# Patient Record
Sex: Female | Born: 1980 | Race: Black or African American | Hispanic: No | Marital: Single | Smoking: Never smoker
Health system: Southern US, Community
[De-identification: ages and names within clinical notes are randomized; demographics above are authoritative.]

---

## 2016-09-07 ENCOUNTER — Emergency Department (HOSPITAL_COMMUNITY)
Admission: EM | Admit: 2016-09-07 | Discharge: 2016-09-07 | Disposition: A | Discharge status: Home / Self Care | Payer: Managed Care, Other (non HMO) | Attending: Emergency Medicine | Admitting: Emergency Medicine

## 2016-09-07 ENCOUNTER — Emergency Department (HOSPITAL_COMMUNITY): Payer: Managed Care, Other (non HMO)

## 2016-09-07 ENCOUNTER — Encounter (HOSPITAL_COMMUNITY): Payer: Self-pay | Admitting: *Deleted

## 2016-09-07 DIAGNOSIS — G43809 Other migraine, not intractable, without status migrainosus: Secondary | ICD-10-CM | POA: Insufficient documentation

## 2016-09-07 DIAGNOSIS — G43109 Migraine with aura, not intractable, without status migrainosus: Secondary | ICD-10-CM

## 2016-09-07 DIAGNOSIS — R51 Headache: Secondary | ICD-10-CM | POA: Diagnosis present

## 2016-09-07 DIAGNOSIS — M79606 Pain in leg, unspecified: Secondary | ICD-10-CM | POA: Insufficient documentation

## 2016-09-07 LAB — COMPREHENSIVE METABOLIC PANEL
ALK PHOS: 53 U/L (ref 38–126)
ALT: 19 U/L (ref 14–54)
ANION GAP: 9 (ref 5–15)
AST: 19 U/L (ref 15–41)
Albumin: 3.7 g/dL (ref 3.5–5.0)
BILIRUBIN TOTAL: 0.3 mg/dL (ref 0.3–1.2)
BUN: 15 mg/dL (ref 6–20)
CALCIUM: 9.3 mg/dL (ref 8.9–10.3)
CO2: 23 mmol/L (ref 22–32)
CREATININE: 0.67 mg/dL (ref 0.44–1.00)
Chloride: 106 mmol/L (ref 101–111)
GFR calc non Af Amer: >60 mL/min (ref >60)
Glucose, Bld: 92 mg/dL (ref 65–99)
Potassium: 4 mmol/L (ref 3.5–5.1)
SODIUM: 138 mmol/L (ref 135–145)
TOTAL PROTEIN: 7.4 g/dL (ref 6.5–8.1)

## 2016-09-07 LAB — CBC WITH DIFFERENTIAL/PLATELET
Basophils Absolute: 0 10*3/uL (ref 0.0–0.1)
Basophils Relative: 0 %
EOS ABS: 0.2 10*3/uL (ref 0.0–0.7)
Eosinophils Relative: 3 %
HEMATOCRIT: 34 % — AB (ref 36.0–46.0)
HEMOGLOBIN: 11.9 g/dL — AB (ref 12.0–15.0)
LYMPHS ABS: 2.5 10*3/uL (ref 0.7–4.0)
LYMPHS PCT: 36 %
MCH: 27.1 pg (ref 26.0–34.0)
MCHC: 35 g/dL (ref 30.0–36.0)
MCV: 77.4 fL — AB (ref 78.0–100.0)
MONOS PCT: 7 %
Monocytes Absolute: 0.5 10*3/uL (ref 0.1–1.0)
NEUTROS ABS: 3.8 10*3/uL (ref 1.7–7.7)
NEUTROS PCT: 54 %
Platelets: 338 10*3/uL (ref 150–400)
RBC: 4.39 MIL/uL (ref 3.87–5.11)
RDW: 13.9 % (ref 11.5–15.5)
WBC: 6.9 10*3/uL (ref 4.0–10.5)

## 2016-09-07 LAB — I-STAT BETA HCG BLOOD, ED (MC, WL, AP ONLY): I-stat hCG, quantitative: <5 m[IU]/mL (ref <5)

## 2016-09-07 MED ORDER — SODIUM CHLORIDE 0.9 % IV BOLUS (SEPSIS)
1000.0000 mL | Freq: Once | INTRAVENOUS | Status: AC
  Administered 2016-09-07: 1000 mL via INTRAVENOUS

## 2016-09-07 MED ORDER — PROMETHAZINE HCL 25 MG/ML IJ SOLN
25.0000 mg | Freq: Once | INTRAMUSCULAR | Status: AC
  Administered 2016-09-07: 25 mg via INTRAVENOUS
  Filled 2016-09-07: qty 1

## 2016-09-07 NOTE — ED Notes (Signed)
Bed: WA02 Expected date:  Expected time:  Means of arrival:  Comments: EMS-H/A

## 2016-09-07 NOTE — Discharge Instructions (Signed)
Continue excedrin for headaches.   See your neurologist outpatient   Return to ER if you have worse headaches, vomiting, fever, neck pain, blurry vision, numbness, weakness

## 2016-09-07 NOTE — ED Triage Notes (Addendum)
Per EMS, pt comes from work. Pt called EMS d/t "migraine coming on" and sudden leg pain. Pt had pain in legs with movement on scene, but denies leg pain at this time and states the leg pain has resolved.  Pt stated she took 2 excedrin migraine tablets before EMS arrived and now states pain is 2/10 and she would like to go home.  Pt is alert x 4 and ambulatory, no neuro deficits on primary assessment.

## 2016-09-07 NOTE — ED Provider Notes (Signed)
[facility]-EMERGENCY DEPT Provider Note   CSN: [id number] Arrival date & time: 09/07/16  1233     History   Chief Complaint Chief Complaint  Patient presents with  . Headache    HPI Taylor Tanner is a 36 y.o. female history of pseudotumor cerebri, migraines, here presenting with headache, leg pain and numbness. Patient states that she has sudden onset of headache around 10:30 AM this morning. It is associated with bilateral leg numbness and mild blurry vision. She did not eat anything for breakfast and took 2 Excedrin  food but still had headache so called EMS to come to the ED. She states that since she took 2 Excedrin's, her headache has improved and her numbness has improved. Patient told me that she has a history of pseudotumor cerebri that was confirmed on LP several years ago. At that time she had very bad blurry vision. She was on diamox but couldn't afford it so is taking lasix currently. She also has chronic back pain and hx of sciatica.    The history is provided by the patient.    History reviewed. No pertinent past medical history.  There are no active problems to display for this patient.   History reviewed. No pertinent surgical history.  OB History    No data available       Home Medications    Prior to Admission medications   Medication Sig Start Date End Date Taking? Authorizing Provider  aspirin-acetaminophen-caffeine (EXCEDRIN MIGRAINE) [phone] MG tablet Take 2 tablets by mouth every 6 (six) hours as needed for headache.   Yes [provider]  furosemide (LASIX) 20 MG tablet Take 20 mg by mouth daily. 09/03/16  Yes [provider]    Family History No family history on file.  Social History Social History  Substance Use Topics  . Smoking status: Never Smoker  . Smokeless tobacco: Never Used  . Alcohol use No     Allergies   Other   Review of Systems Review of Systems  Neurological: Positive for numbness and headaches.  All  other systems reviewed and are negative.    Physical Exam Updated Vital Signs BP (!) 136/92 (BP Location: Right Arm)   Pulse 93   Temp 98.3 F (36.8 C) (Oral)   Resp 18   Ht 5\' 4"  (1.626 m)   Wt 127 kg (280 lb)   LMP 08/17/2016 (Exact Date)   SpO2 100%   BMI 48.06 kg/m   Physical Exam  Constitutional: She is oriented to person, place, and time.  Slightly uncomfortable   HENT:  Head: Normocephalic.  Mouth/Throat: Oropharynx is clear and moist.  Eyes: EOM are normal. Pupils are equal, round, and reactive to light.  No obvious papilledema on fundoscopic exam   Neck: Normal range of motion. Neck supple.  Cardiovascular: Normal rate, regular rhythm and normal heart sounds.   Pulmonary/Chest: Effort normal and breath sounds normal. No respiratory distress. She has no wheezes. She has no rales.  Abdominal: Soft. Bowel sounds are normal. She exhibits no distension. There is no tenderness. There is no guarding.  Musculoskeletal: Normal range of motion.  Neurological: She is alert and oriented to person, place, and time. No cranial nerve deficit. Coordination normal.  CN 2-12 intact. Nl strength and sensation throughout. No saddle anesthesia   Skin: Skin is warm.  Psychiatric: She has a normal mood and affect.  Nursing note and vitals reviewed.    ED Treatments / Results  Labs (all labs ordered are listed,  but only abnormal results are displayed) Labs Reviewed  CBC WITH DIFFERENTIAL/PLATELET - Abnormal; Notable for the following:       Result Value   Hemoglobin 11.9 (*)    HCT 34.0 (*)    MCV 77.4 (*)    All other components within normal limits  COMPREHENSIVE METABOLIC PANEL  I-STAT BETA HCG BLOOD, ED ([facility], [facility], [facility] ONLY)    EKG  EKG Interpretation None       Radiology Ct Head Wo Contrast  Result Date: 09/07/2016 CLINICAL DATA:  Severe migraine.  This began yesterday. EXAM: CT HEAD WITHOUT CONTRAST TECHNIQUE: Contiguous axial images were obtained from the base of  the skull through the vertex without intravenous contrast. COMPARISON:  None. FINDINGS: Brain: No evidence of acute infarction, hemorrhage, hydrocephalus, extra-axial collection or mass lesion/mass effect. Normal cerebral volume. No white matter disease. Vascular: No hyperdense vessel or unexpected calcification. Skull: Normal. Negative for fracture or focal lesion. Sinuses/Orbits: No acute finding. Other: None. IMPRESSION: Negative exam. Electronically Signed   By: Elsie Stain M.D.   On: 09/07/2016 14:46    Procedures Procedures (including critical care time)  Medications Ordered in ED Medications  sodium chloride 0.9 % bolus 1,000 mL (1,000 mLs Intravenous New Bag/Given 09/07/16 1325)  promethazine (PHENERGAN) injection 25 mg (25 mg Intravenous Given 09/07/16 1332)     Initial Impression / Assessment and Plan / ED Course  I have reviewed the triage vital signs and the nursing notes.  Pertinent labs & imaging results that were available during my care of the patient were reviewed by me and considered in my medical decision making (see chart for details).     Taylor Tanner is a 36 y.o. female here with headache. Has hx of pseudotumor and hx of migraines. Has nl neuro exam and no obvious optic disc edema on fundoscopic exam. Consider SAH given sudden onset headache. Within 4 hr window so if CT head neg, won't need LP. Will give migraine cocktail. I think likely complex migraines.   2:56 PM Labs and CT head unremarkable. Headaches controlled. Leg numbness improved and nonfocal neuro exam. Has neuro follow up at [facility]. Recommend continue excedrin prn, neurology follow up.   Final Clinical Impressions(s) / ED Diagnoses   Final diagnoses:  None    New Prescriptions New Prescriptions   No medications on file     Charlynne Pander, MD 09/07/16 1456

## 2017-12-18 IMAGING — CT CT HEAD W/O CM
3 of 4 series · 14 of 47 positions shown, 16 images · non-contrast
Comparison: None.

CLINICAL DATA: Severe migraine.  This began yesterday.

EXAM:
CT HEAD WITHOUT CONTRAST
TECHNIQUE: Contiguous axial images were obtained from the base of the skull
through the vertex without intravenous contrast.

[Series 2: head w/o · axial · non-contrast · 0.45mm/px · z∈[+1646,+1776]mm · 8 of 32 slices shown, 10 images]
[im 3/32  brain]
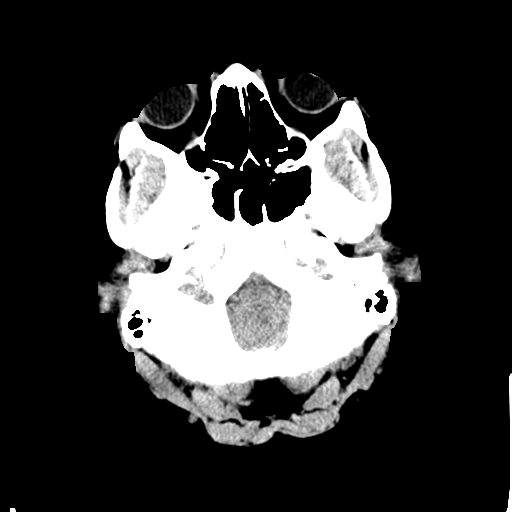
[im 3/32  bone]
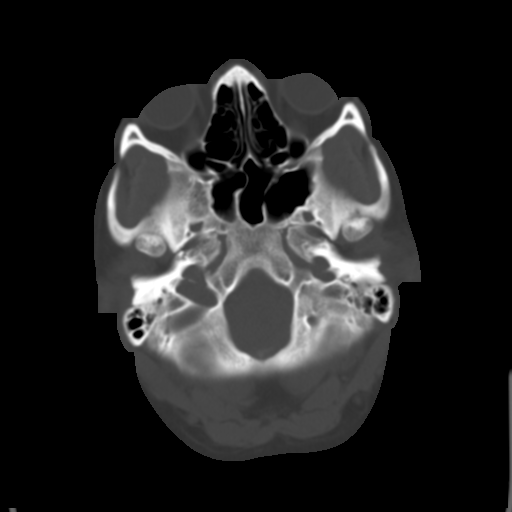
[im 7/32  brain]
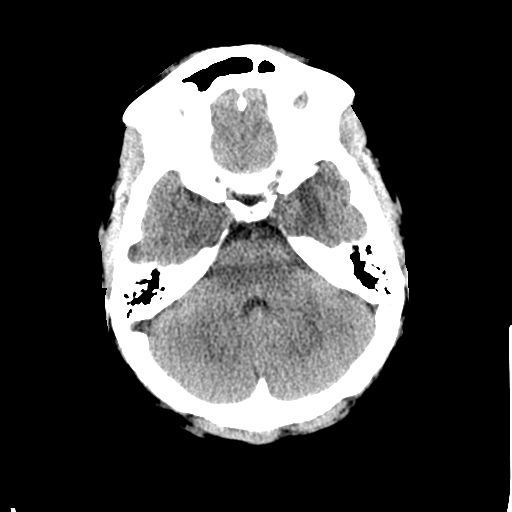
[im 12/32  brain]
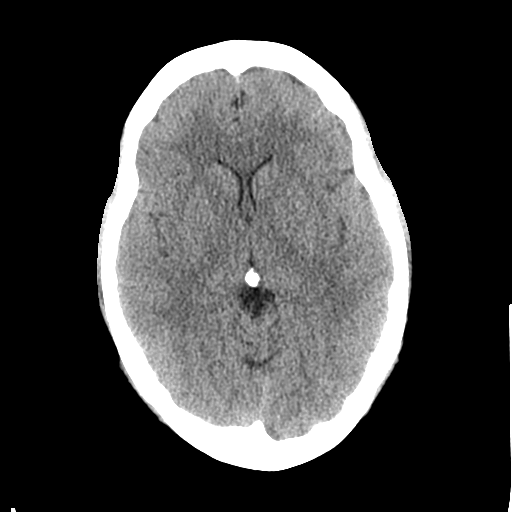
[im 14/32  brain]
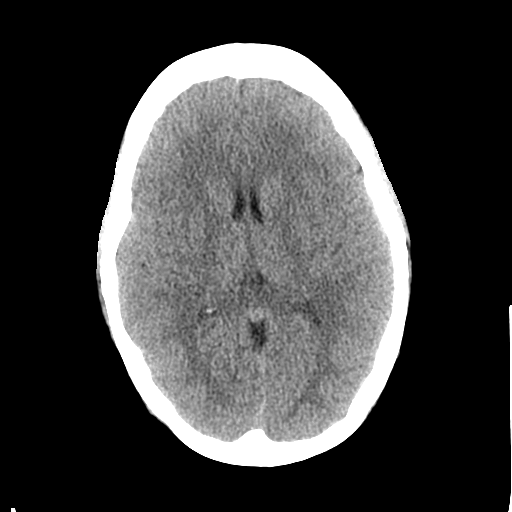
[im 18/32  brain]
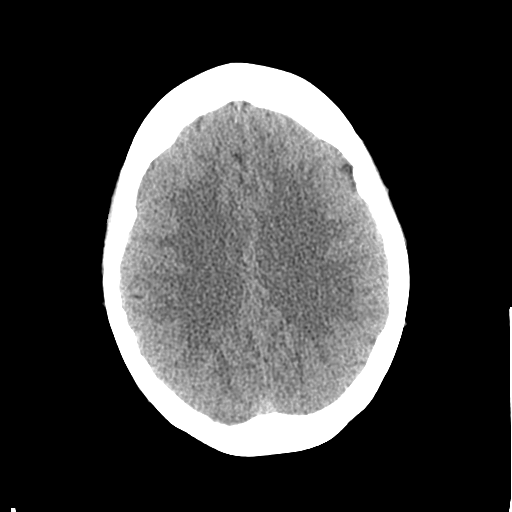
[im 18/32  bone]
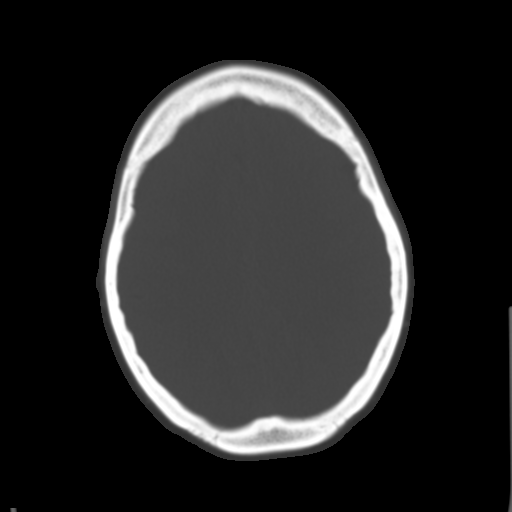
[im 20/32  brain]
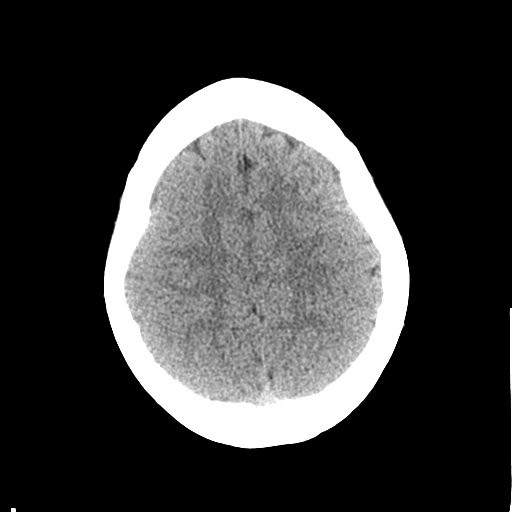
[im 25/32  brain]
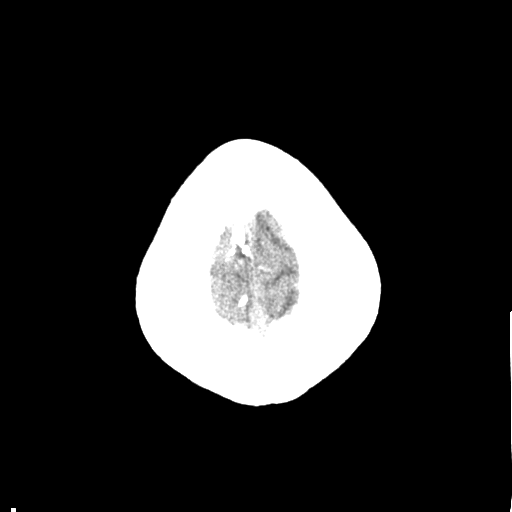
[im 29/32  brain]
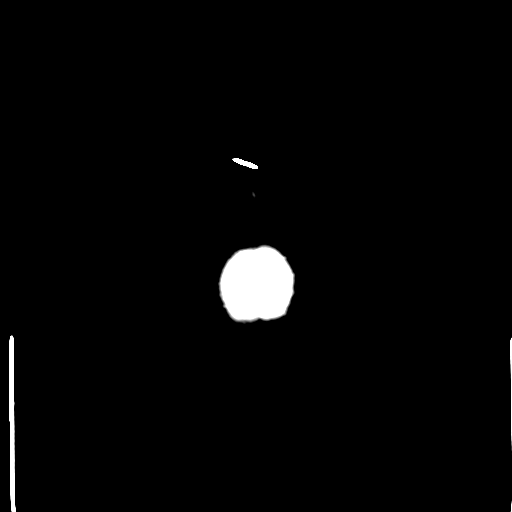

[Series 4: coronal · coronal · 0.31mm/px · 3 of 77 slices shown]
[im 26/77  brain]
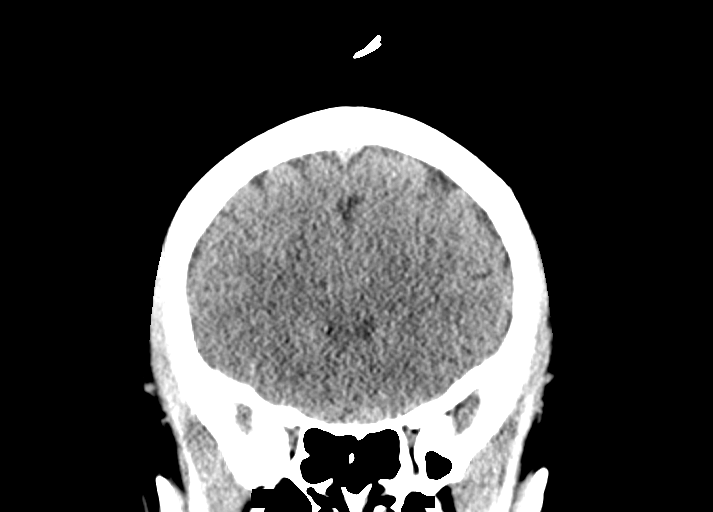
[im 34/77  brain]
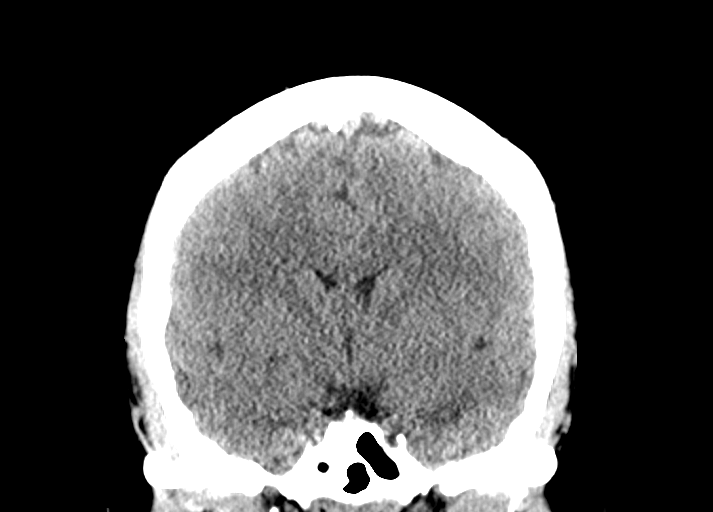
[im 43/77  brain]
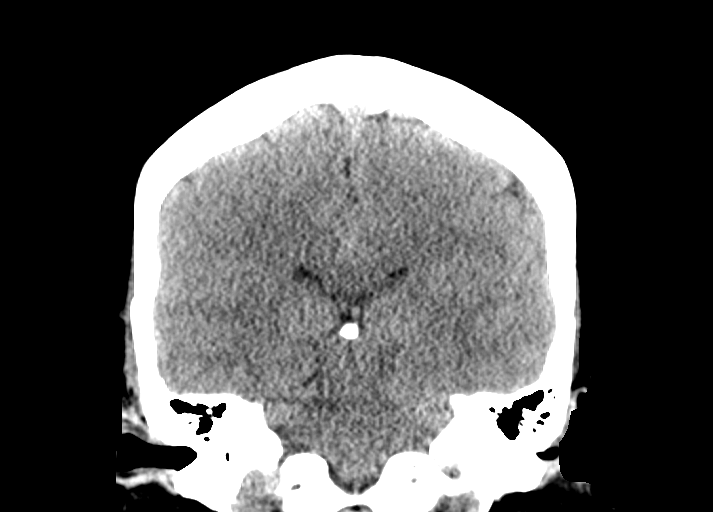

[Series 5: sagittal · sagittal · 0.31mm/px · 3 of 66 slices shown]
[im 22/66  brain]
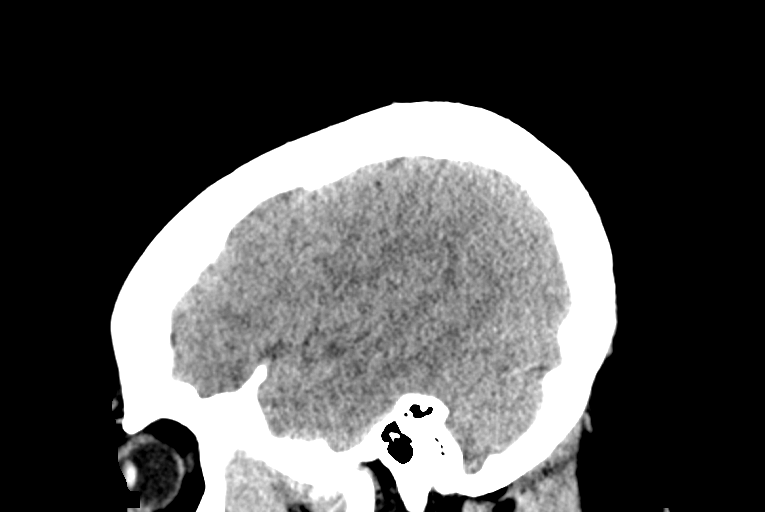
[im 33/66  brain]
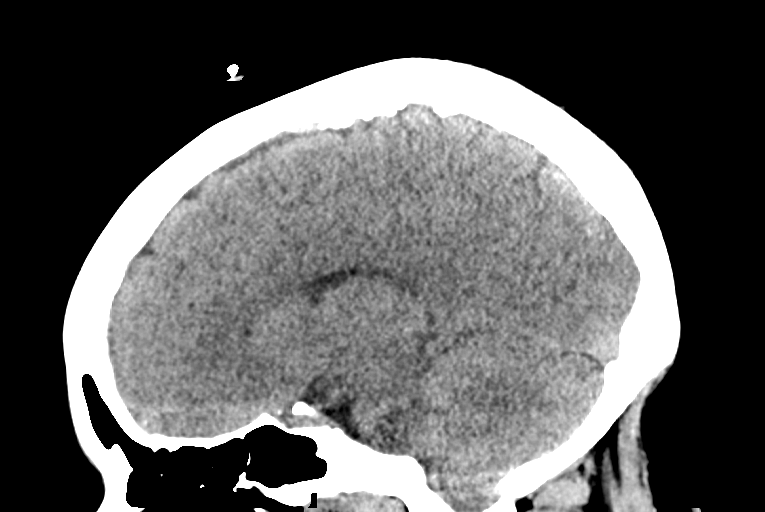
[im 44/66  brain]
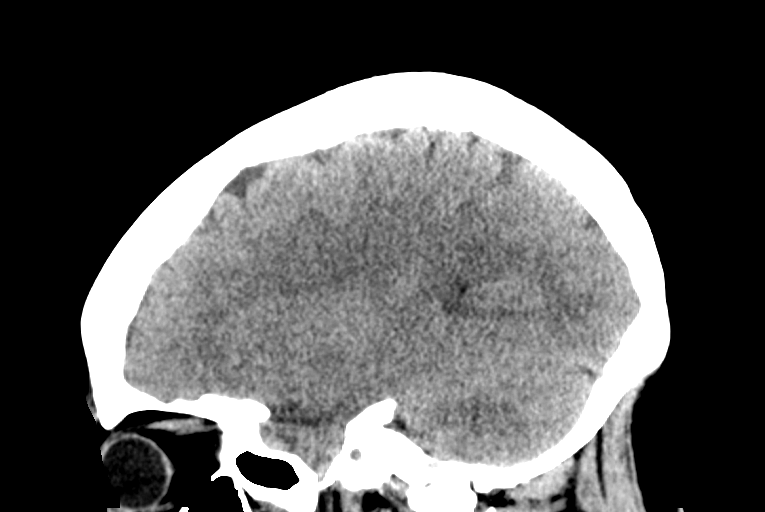

[14 of 47 positions shown; findings below may reference images not displayed]

FINDINGS: Brain: No evidence of acute infarction, hemorrhage, hydrocephalus,
extra-axial collection or mass lesion/mass effect. Normal cerebral
volume. No white matter disease.

Vascular: No hyperdense vessel or unexpected calcification.

Skull: Normal. Negative for fracture or focal lesion.

Sinuses/Orbits: No acute finding.

Other: None.
IMPRESSION: Negative exam.
